# Patient Record
Sex: Male | Born: 1945 | Hispanic: Yes | Marital: Married | Smoking: Former smoker
Health system: Southern US, Community
[De-identification: ages and names within clinical notes are randomized; demographics above are authoritative.]

## PROBLEM LIST (undated history)

## (undated) DIAGNOSIS — M25569 Pain in unspecified knee: Secondary | ICD-10-CM

## (undated) HISTORY — PX: EYE SURGERY: SHX253

## (undated) HISTORY — PX: KNEE ARTHROSCOPY: SUR90

---

## 2011-07-05 ENCOUNTER — Ambulatory Visit: Payer: Self-pay | Admitting: Sports Medicine

## 2013-05-26 ENCOUNTER — Emergency Department: Payer: Self-pay | Admitting: Emergency Medicine

## 2014-02-09 ENCOUNTER — Ambulatory Visit: Payer: Self-pay | Admitting: Specialist

## 2014-05-18 IMAGING — CR DG KNEE COMPLETE 4+V*L*
1 series · 4 of 4 positions shown · non-contrast
Comparison: none

REASON FOR EXAM: pain/edema/ecchymosis 2nd to blunt trauma
COMMENTS:   May transport without cardiac monitor

PROCEDURE:     DXR - DXR KNEE LT COMP WITH OBLIQUES  - May 26, 2013 [DATE]
RESULT:

[Series 1: t knee ap left · 0.14mm/px · 4 of 4 slices shown]
[im 1/4]
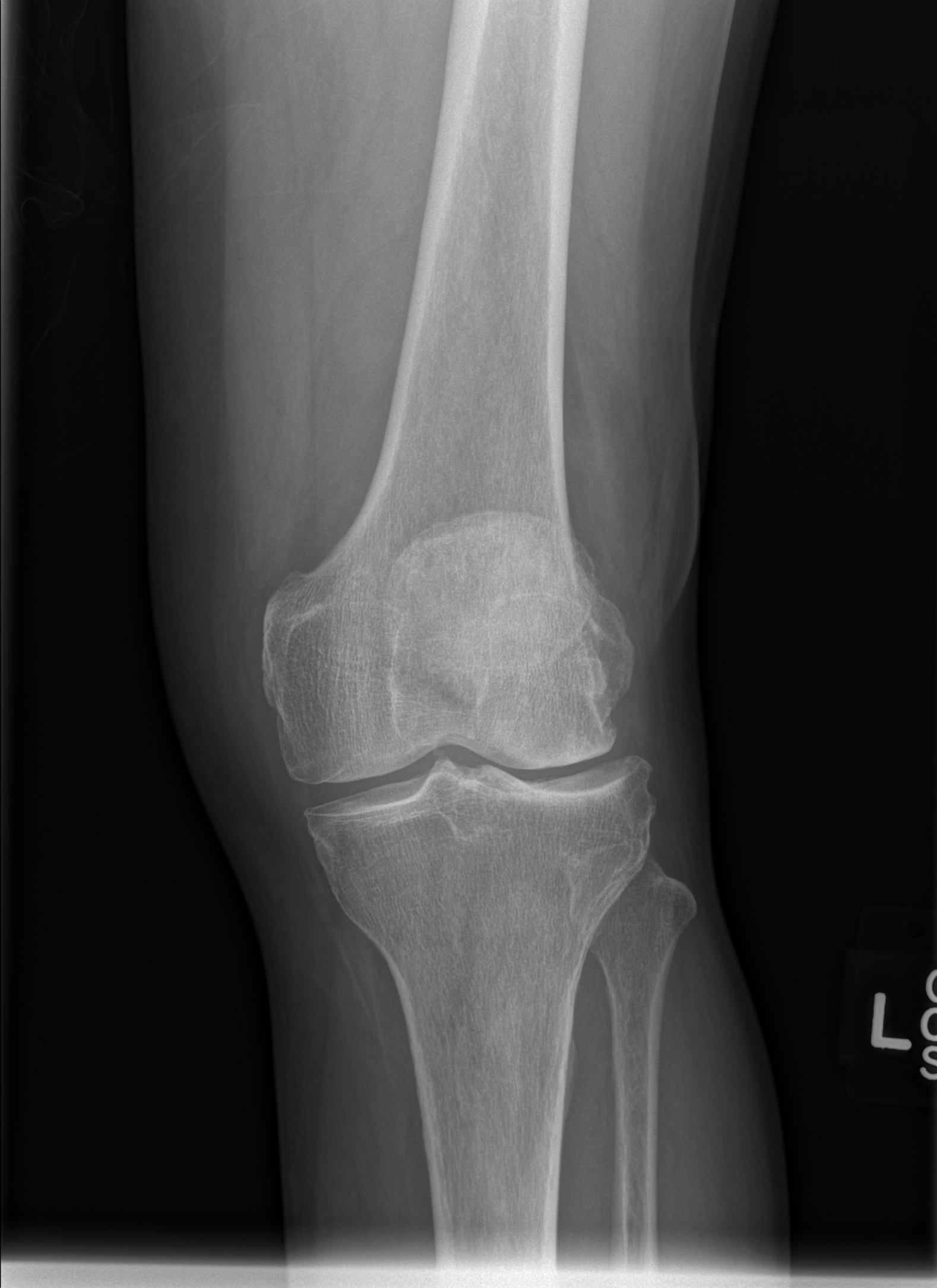
[im 2/4]
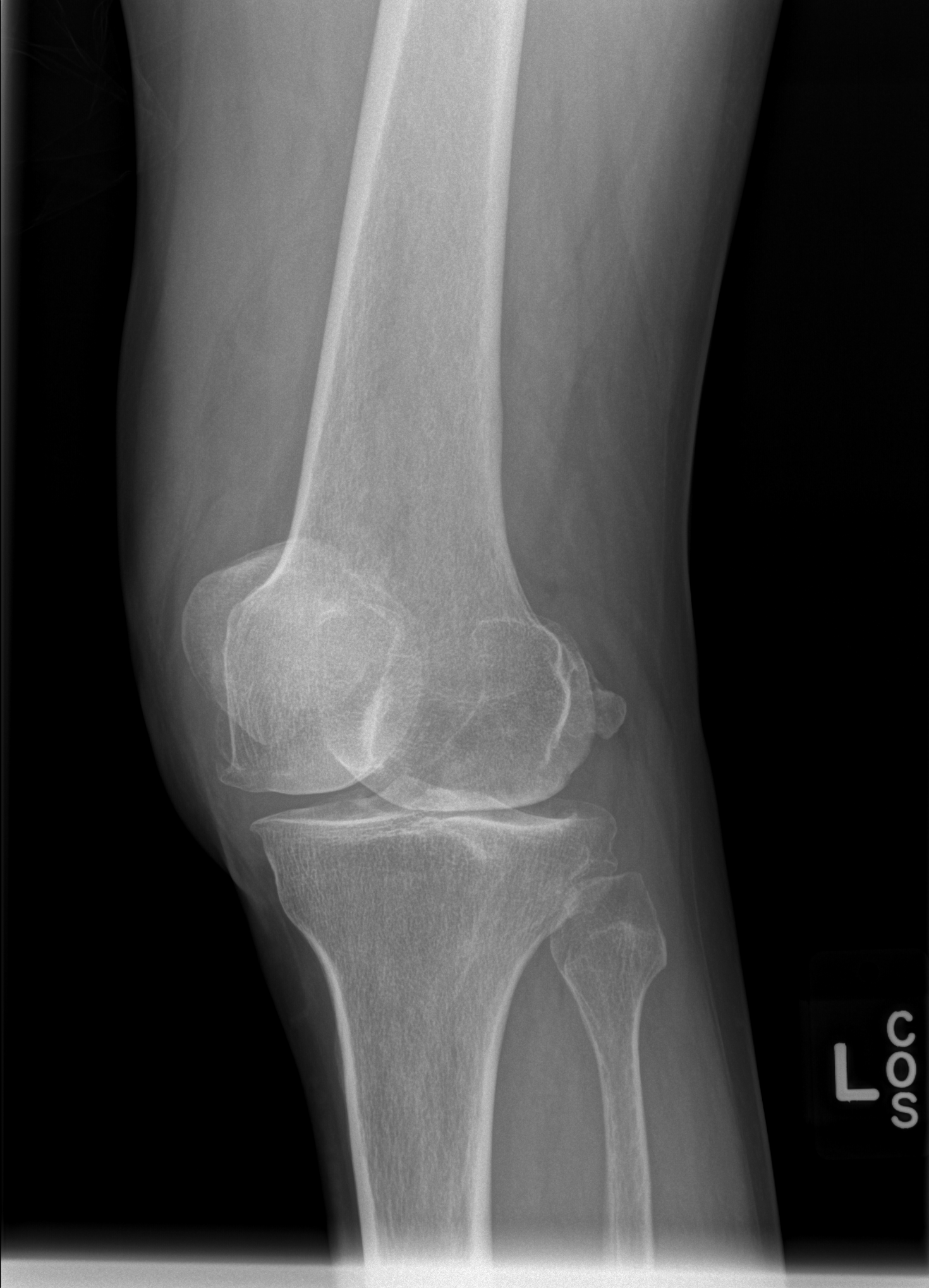
[im 3/4]
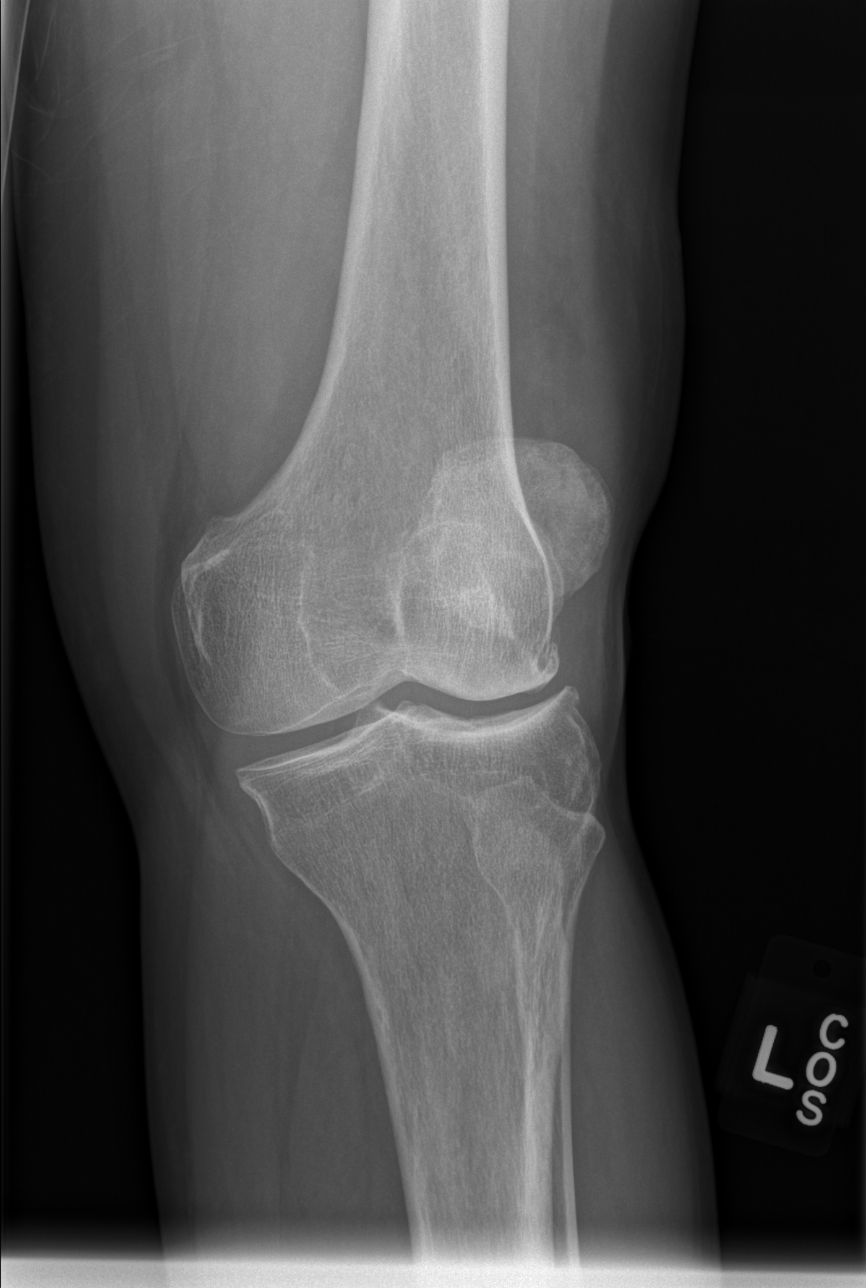
[im 4/4]
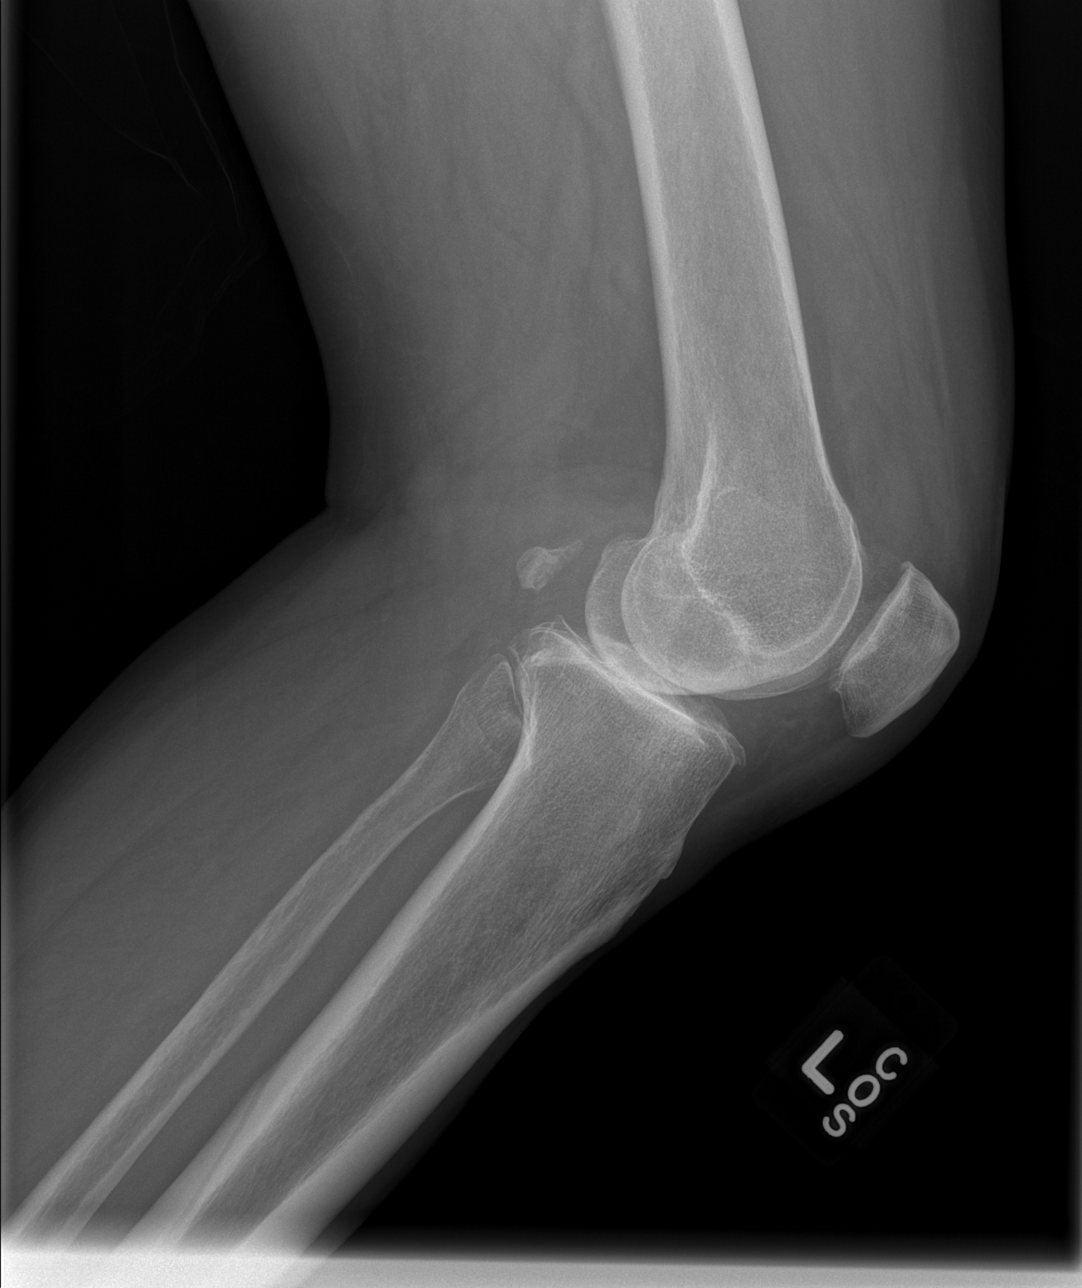

[4 of 4 positions shown; findings below may reference images not displayed]

FINDINGS: Osteophytosis is identified along the periphery of the femoral
condyles and tibial plateau as well as the articular portion of the patella.
There are findings consistent with a fabella. There is no evidence of acute
fracture or dislocation.
IMPRESSION: Osteoarthritic changes without evidence of acute osseous
abnormalities. If there is persistent clinical concern, further evaluation
with MRI is recommended.

## 2014-12-05 NOTE — Op Note (Signed)
PATIENT NAME:  Ralph Cobb, Ralph Cobb MR#:  [id number] DATE OF BIRTH:  [birth year]  DATE OF PROCEDURE:  02/09/2014  PREOPERATIVE DIAGNOSES:  1.  Tear of the left medial and lateral menisci.  2.  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  4.  Extensive synovitis.   POSTOPERATIVE DIAGNOSES:  1.  Tear of the left medial and lateral menisci.  2.  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  4.  Extensive synovitis.   PROCEDURES: 1.  Arthroscopic partial left medial and lateral meniscectomies.  2.  Arthroscopic chondroplasty lateral femur and tibia. 3.  Moderate synovectomy.   SURGEON: Valinda HoarHoward E. Miller, M.D.   ANESTHESIA: General LMA.   COMPLICATIONS: None.   DRAINS: None.   ESTIMATED BLOOD LOSS: Minimal.   REPLACEMENTS: None.   DESCRIPTION OF PROCEDURE: The patient was brought to the operating room where he underwent satisfactory general LMA anesthesia in the supine position. The left leg was prepped and draped in a sterile fashion and arthroscopy carried out through standard portals. The findings included tear of the posterior horn of the medial meniscus. The articular surfaces had minimal chondromalacia. The lateral compartment showed grade IV chondromalacia on the femur and tibia and some macerated lateral meniscus. The intercondylar notch was normal with intact anterior and posterior cruciates. The patellofemoral articulation showed mild grade II to III chondromalacia. There was extensive synovitis anteriorly and superiorly. After surveying the joint, the medial meniscus was debrided with a basket forceps and a motorized resector. The ArthroCare wand was used to stabilize this. Laterally the extensive debridement was carried out of the meniscus and soft tissues. A motorized resector and ArthroCare wand were used for this. Basket forceps was used. After this was trimmed back to healthy tissue, the partial synovectomy was carried out. ArthroCare was  used to cauterize bleeders. The joint was thoroughly irrigated free of debris and closed with 3-0 nylon sutures. Then 0.5% Marcaine with epinephrine and morphine was placed in the joint. Dry sterile dressing was applied. The tourniquet was not used. The patient was awakened and taken to recovery in good condition.  ____________________________ Valinda HoarHoward E. Miller, MD hem:sb D: 02/09/2014 14:42:26 ET T: 02/09/2014 15:00:40 ET JOB#: [id number]  cc: Valinda HoarHoward E. Miller, MD, <Dictator> Valinda HoarHOWARD E MILLER MD ELECTRONICALLY SIGNED 02/10/2014 10:58

## 2014-12-22 ENCOUNTER — Encounter
Admission: RE | Admit: 2014-12-22 | Discharge: 2014-12-22 | Disposition: A | Discharge status: Home / Self Care | Payer: BLUE CROSS/BLUE SHIELD | Source: Ambulatory Visit | Attending: Ophthalmology | Admitting: Ophthalmology

## 2014-12-22 DIAGNOSIS — Z01810 Encounter for preprocedural cardiovascular examination: Secondary | ICD-10-CM | POA: Diagnosis not present

## 2014-12-24 ENCOUNTER — Encounter: Payer: Self-pay | Admitting: *Deleted

## 2014-12-28 NOTE — OR Nursing (Signed)
ekg ok per anesthesia 

## 2014-12-28 NOTE — OR Nursing (Signed)
EKG TO ANESTHESIA FOR REVIEW

## 2014-12-30 ENCOUNTER — Ambulatory Visit: Admission: RE | Admit: 2014-12-30 | Payer: BLUE CROSS/BLUE SHIELD | Source: Ambulatory Visit | Admitting: Ophthalmology

## 2014-12-30 HISTORY — DX: Pain in unspecified knee: M25.569

## 2014-12-30 SURGERY — PARS PLANA VITRECTOMY WITH 25 GAUGE
Anesthesia: Choice | Laterality: Right

## 2018-07-30 ENCOUNTER — Ambulatory Visit (INDEPENDENT_AMBULATORY_CARE_PROVIDER_SITE_OTHER): Payer: 59 | Admitting: Vascular Surgery

## 2018-07-30 ENCOUNTER — Encounter (INDEPENDENT_AMBULATORY_CARE_PROVIDER_SITE_OTHER): Payer: Self-pay | Admitting: Vascular Surgery

## 2018-07-30 VITALS — BP 159/74 | HR 54 | Resp 16 | Ht 62.0 in | Wt 174.4 lb

## 2018-07-30 DIAGNOSIS — M79609 Pain in unspecified limb: Secondary | ICD-10-CM | POA: Insufficient documentation

## 2018-07-30 DIAGNOSIS — I739 Peripheral vascular disease, unspecified: Secondary | ICD-10-CM | POA: Diagnosis not present

## 2018-07-30 DIAGNOSIS — M79604 Pain in right leg: Secondary | ICD-10-CM | POA: Diagnosis not present

## 2018-07-30 DIAGNOSIS — M79605 Pain in left leg: Secondary | ICD-10-CM

## 2018-07-30 NOTE — Assessment & Plan Note (Signed)
The patient describes pain that is not typical for ischemic pain.  This is more consistent with arthritic knee pain and neuropathic issues.  He has already had a knee replacement on the right with good results.  He does have PAD based on the noninvasive studies performed by his primary care physician.  We are going to get more detailed studies for further evaluation.

## 2018-07-30 NOTE — Progress Notes (Signed)
Patient ID: Ralph Cobb, male   DOB: October 23, 1945, 72 y.o.   MRN: [id number]  Chief Complaint  Patient presents with  . New Patient (Initial Visit)    HPI Ralph Cobb is a 72 y.o. male.  I am asked to see the patient by Dr. Ephraim HamburgerMancheno for evaluation of PAD.  The patient reports dominantly pain in his left knee and lower leg.  This hurts a fair bit when he walks.  He reports no open ulcerations or infection.  He does not describe typical claudication symptoms.  His right knee was hurting significantly before he had a knee replacement that seems to have corrected that.  No fevers or chills.  No chest pain or shortness of breath.  As part of his work-up, his primary care physician ordered ABIs.  I do not have those studies for review, but by his primary care physician's report the right ABI was markedly reduced at 0.21.  I do not know what his left ABI was.   Past Medical History:  Diagnosis Date  . Knee pain     Past Surgical History:  Procedure Laterality Date  . EYE SURGERY    . KNEE ARTHROSCOPY      Family History No bleeding disorders, clotting disorders, autoimmune diseases, or aneurysms  Social History Social History   Tobacco Use  . Smoking status: Former Games developermoker  . Smokeless tobacco: Never Used  Substance Use Topics  . Alcohol use: No  . Drug use: Not on file    No Known Allergies  Current Outpatient Medications  Medication Sig Dispense Refill  . bimatoprost (LUMIGAN) 0.01 % SOLN Lumigan 0.01 % eye drops    . Brinzolamide-Brimonidine (SIMBRINZA) 1-0.2 % SUSP Simbrinza 1 %-0.2 % eye drops,suspension    . DULoxetine (CYMBALTA) 30 MG capsule Take 30 mg by mouth daily.    . meloxicam (MOBIC) 7.5 MG tablet meloxicam 7.5 mg tablet     No current facility-administered medications for this visit.       REVIEW OF SYSTEMS (Negative unless checked)  Constitutional: [] Weight loss  [] Fever  [] Chills Cardiac: [] Chest pain   [] Chest pressure   [] Palpitations    [] Shortness of breath when laying flat   [] Shortness of breath at rest   [] Shortness of breath with exertion. Vascular:  [x] Pain in legs with walking   [x] Pain in legs at rest   [] Pain in legs when laying flat   [] Claudication   [] Pain in feet when walking  [] Pain in feet at rest  [] Pain in feet when laying flat   [] History of DVT   [] Phlebitis   [] Swelling in legs   [] Varicose veins   [] Non-healing ulcers Pulmonary:   [] Uses home oxygen   [] Productive cough   [] Hemoptysis   [] Wheeze  [] COPD   [] Asthma Neurologic:  [] Dizziness  [] Blackouts   [] Seizures   [] History of stroke   [] History of TIA  [] Aphasia   [] Temporary blindness   [] Dysphagia   [] Weakness or numbness in arms   [] Weakness or numbness in legs Musculoskeletal:  [x] Arthritis   [] Joint swelling   [x] Joint pain   [] Low back pain Hematologic:  [] Easy bruising  [] Easy bleeding   [] Hypercoagulable state   [] Anemic  [] Hepatitis Gastrointestinal:  [] Blood in stool   [] Vomiting blood  [] Gastroesophageal reflux/heartburn   [] Abdominal pain Genitourinary:  [] Chronic kidney disease   [] Difficult urination  [] Frequent urination  [] Burning with urination   [] Hematuria Skin:  [] Rashes   [] Ulcers   [] Wounds Psychological:  [] History of  anxiety   []  History of major depression.    Physical Exam BP (!) 159/74 (BP Location: Right Arm, Patient Position: Sitting)   Pulse (!) 54   Resp 16   Ht 5\' 2"  (1.575 m)   Wt 174 lb 6.4 oz (79.1 kg)   BMI 31.90 kg/m  Gen:  WD/WN, NAD Head: [place]/AT, No temporalis wasting.  Ear/Nose/Throat: Hearing grossly intact, nares w/o erythema or drainage, oropharynx w/o Erythema/Exudate Eyes: Conjunctiva clear, sclera non-icteric  Neck: trachea midline.   Pulmonary:  Good air movement, respirations not labored, no use of accessory muscles Cardiac: RRR, no JVD Vascular:  Vessel Right Left  Radial Palpable Palpable                          PT  1+ palpable  1+ palpable  DP  not palpable  1+ palpable    Gastrointestinal: soft, non-tender/non-distended.  Musculoskeletal: M/S 5/5 throughout.  Extremities without ischemic changes.  No deformity or atrophy.  No edema. Neurologic: Sensation grossly intact in extremities.  Symmetrical.  Speech is fluent. Motor exam as listed above. Psychiatric: Judgment intact, Mood & affect appropriate for pt's clinical situation. Dermatologic: No rashes or ulcers noted.  No cellulitis or open wounds.   Radiology No results found.  Labs No results found for this or any previous visit (from the past 2160 hour(s)).  Assessment/Plan:  Pain in limb The patient describes pain that is not typical for ischemic pain.  This is more consistent with arthritic knee pain and neuropathic issues.  He has already had a knee replacement on the right with good results.  He does have PAD based on the noninvasive studies performed by his primary care physician.  We are going to get more detailed studies for further evaluation.  PAD (peripheral artery disease) (HCC) The patient had noninvasive studies performed by his primary care physician which would suggest critical limb ischemia in the right leg with an ABI of 0.21.  I did not get the report of the left ABI.  His symptoms do not seem as consistent with critical limb ischemia, but further evaluation with more detailed studies is certainly warranted at this time.  I discussed the pathophysiology and natural history of peripheral arterial disease.  I have discussed the general treatment options.  We will get these noninvasive studies in the near future at his convenience.  We will see him back following the studies to discuss the results and determine further treatment options.      Festus Barren 07/30/2018, 1:24 PM   This note was created with Dragon medical transcription system.  Any errors from dictation are unintentional.

## 2018-07-30 NOTE — Patient Instructions (Signed)

## 2018-07-30 NOTE — Assessment & Plan Note (Signed)
The patient had noninvasive studies performed by his primary care physician which would suggest critical limb ischemia in the right leg with an ABI of 0.21.  I did not get the report of the left ABI.  His symptoms do not seem as consistent with critical limb ischemia, but further evaluation with more detailed studies is certainly warranted at this time.  I discussed the pathophysiology and natural history of peripheral arterial disease.  I have discussed the general treatment options.  We will get these noninvasive studies in the near future at his convenience.  We will see him back following the studies to discuss the results and determine further treatment options.

## 2018-07-31 ENCOUNTER — Ambulatory Visit (INDEPENDENT_AMBULATORY_CARE_PROVIDER_SITE_OTHER): Payer: Medicare Other | Admitting: Nurse Practitioner

## 2018-07-31 ENCOUNTER — Ambulatory Visit (INDEPENDENT_AMBULATORY_CARE_PROVIDER_SITE_OTHER): Payer: 59

## 2018-07-31 ENCOUNTER — Other Ambulatory Visit (INDEPENDENT_AMBULATORY_CARE_PROVIDER_SITE_OTHER): Payer: Self-pay | Admitting: Vascular Surgery

## 2018-07-31 ENCOUNTER — Encounter (INDEPENDENT_AMBULATORY_CARE_PROVIDER_SITE_OTHER): Payer: Self-pay | Admitting: Nurse Practitioner

## 2018-07-31 VITALS — BP 159/84 | HR 53 | Resp 12 | Ht 65.0 in | Wt 174.8 lb

## 2018-07-31 DIAGNOSIS — M1710 Unilateral primary osteoarthritis, unspecified knee: Secondary | ICD-10-CM | POA: Insufficient documentation

## 2018-07-31 DIAGNOSIS — M179 Osteoarthritis of knee, unspecified: Secondary | ICD-10-CM | POA: Insufficient documentation

## 2018-07-31 DIAGNOSIS — I739 Peripheral vascular disease, unspecified: Secondary | ICD-10-CM

## 2018-07-31 DIAGNOSIS — E785 Hyperlipidemia, unspecified: Secondary | ICD-10-CM | POA: Diagnosis not present

## 2018-07-31 DIAGNOSIS — G8929 Other chronic pain: Secondary | ICD-10-CM

## 2018-07-31 DIAGNOSIS — M25562 Pain in left knee: Secondary | ICD-10-CM

## 2018-07-31 DIAGNOSIS — M25569 Pain in unspecified knee: Secondary | ICD-10-CM | POA: Insufficient documentation

## 2018-07-31 NOTE — Progress Notes (Signed)
Subjective:    Patient ID: Ralph Cobb, male    DOB: [birth year], 72 y.o.   MRN: [id number] Chief Complaint  Patient presents with  . Follow-up    HPI  Ralph Cobb is a 72 y.o. male that presents today to follow-up on concerns for peripheral arterial disease.  The patient states that he had a provider come to his home to screen for Medicare.  During this examination they suggested that he may have decreased circulation and that he need to be evaluated.  The patient complains of pain in his left knee area.  He states his pain has been ongoing for approximately 3 years.  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  His pain is greatest when he rises from a sitting to standing position.  He states that once he begins to ambulate the pain eases away.  He states that the pain is also controlled with Tylenol and/or Advil.  The patient also states that this pain is consistent with the type pain that he had prior to his right knee replacement.  He denies any claudication-like symptoms or rest pain.  He denies any chest pain or shortness of breath.  He denies any TIA-like symptoms or amaurosis fugax.  He denies any fever, chills, nausea, or vomiting.  He underwent bilateral ABIs which revealed an ABI of 1.19 on his right with a TBI of 0.53.  He had an ABI of 1.21 on his left with a TBI of 0.74.  He had bilateral triphasic tibial waveforms.  He also underwent a right lower extremity duplex which revealed triphasic waveforms throughout his leg. Past Medical History:  Diagnosis Date  . Knee pain     Past Surgical History:  Procedure Laterality Date  . EYE SURGERY    . KNEE ARTHROSCOPY      Social History   Socioeconomic History  . Marital status: Married    Spouse name: Not on file  . Number of children: Not on file  . Years of education: Not on file  . Highest education level: Not on file  Occupational History  . Not on file  Social Needs  . Financial resource  strain: Not on file  . Food insecurity:    Worry: Not on file    Inability: Not on file  . Transportation needs:    Medical: Not on file    Non-medical: Not on file  Tobacco Use  . Smoking status: Former Games developermoker  . Smokeless tobacco: Never Used  Substance and Sexual Activity  . Alcohol use: No  . Drug use: Not on file  . Sexual activity: Not on file  Lifestyle  . Physical activity:    Days per week: Not on file    Minutes per session: Not on file  . Stress: Not on file  Relationships  . Social connections:    Talks on phone: Not on file    Gets together: Not on file    Attends religious service: Not on file    Active member of club or organization: Not on file    Attends meetings of clubs or organizations: Not on file    Relationship status: Not on file  . Intimate partner violence:    Fear of current or ex partner: Not on file    Emotionally abused: Not on file    Physically abused: Not on file    Forced sexual activity: Not on file  Other Topics Concern  . Not  on file  Social History Narrative  . Not on file    No family history on file.  No Known Allergies   Review of Systems   Review of Systems: Negative Unless Checked Constitutional: [] Weight loss  [] Fever  [] Chills Cardiac: [] Chest pain   []  Atrial Fibrillation  [] Palpitations   [] Shortness of breath when laying flat   [] Shortness of breath with exertion. [] Shortness of breath at rest Vascular:  [] Pain in legs with walking   [] Pain in legs with standing [] Pain in legs when laying flat   [] Claudication    [] Pain in feet when laying flat    [] History of DVT   [] Phlebitis   [] Swelling in legs   [] Varicose veins   [] Non-healing ulcers Pulmonary:   [] Uses home oxygen   [] Productive cough   [] Hemoptysis   [] Wheeze  [] COPD   [] Asthma Neurologic:  [] Dizziness   [] Seizures  [] Blackouts [] History of stroke   [] History of TIA  [] Aphasia   [] Temporary Blindness   [] Weakness or numbness in arm   [] Weakness or numbness in  leg Musculoskeletal:   [x] Joint swelling   [x] Joint pain   [] Low back pain  [x]  History of Knee Replacement [x] Arthritis [] back Surgeries  []  Spinal Stenosis    Hematologic:  [] Easy bruising  [] Easy bleeding   [] Hypercoagulable state   [] Anemic Gastrointestinal:  [] Diarrhea   [] Vomiting  [] Gastroesophageal reflux/heartburn   [] Difficulty swallowing. [] Abdominal pain Genitourinary:  [] Chronic kidney disease   [] Difficult urination  [] Anuric   [] Blood in urine [] Frequent urination  [] Burning with urination   [] Hematuria Skin:  [] Rashes   [] Ulcers [] Wounds Psychological:  [] History of anxiety   []  History of major depression  []  Memory Difficulties     Objective:   Physical Exam  BP (!) 159/84 (BP Location: Right Arm, Patient Position: Sitting)   Pulse (!) 53   Resp 12   Ht 5\' 5"  (1.651 m)   Wt 174 lb 12.8 oz (79.3 kg)   BMI 29.09 kg/m   Gen: WD/WN, NAD Head: [place]/AT, No temporalis wasting.  Ear/Nose/Throat: Hearing grossly intact, nares w/o erythema or drainage Eyes: PER, EOMI, sclera nonicteric.  Neck: Supple, no masses.  No JVD.  Pulmonary:  Good air movement, no use of accessory muscles.  Cardiac: RRR Vascular:  Vessel Right Left  Radial Palpable Palpable  Dorsalis Pedis Palpable Palpable  Posterior Tibial Palpable Palpable   Gastrointestinal: soft, non-distended. No guarding/no peritoneal signs.  Musculoskeletal: M/S 5/5 throughout.  No deformity or atrophy.  Neurologic: Pain and light touch intact in extremities.  Symmetrical.  Speech is fluent. Motor exam as listed above. Psychiatric: Judgment intact, Mood & affect appropriate for pt's clinical situation. Dermatologic: No Venous rashes. No Ulcers Noted.  No changes consistent with cellulitis. Lymph : No Cervical lymphadenopathy, no lichenification or skin changes of chronic lymphedema.      Assessment & Plan:   1. Chronic pain of left knee Based upon the patient's description of pain as well as prior right knee  replacement, it is likely that the pain is due to osteoarthritis.  The patient agrees that this pain is also consistent with arthritis pain he feels.  He will follow-up with his orthopedic provider.  2. Hyperlipidemia, unspecified hyperlipidemia type Continue statin as ordered and reviewed, no changes at this time   3. PAD (peripheral artery disease) (HCC) He underwent bilateral ABIs which revealed an ABI of 1.19 on his right with a TBI of 0.53.  He had an ABI of 1.21 on his  left with a TBI of 0.74.  He had bilateral triphasic tibial waveforms.  He also underwent a right lower extremity duplex which revealed triphasic waveforms throughout his leg.  Peripheral arterial disease screening done by his primary care office had an ABI of 0.21 on his right lower extremity.  Based upon the results of the patient's test, as well as his descriptions of pain it is unlikely that he has peripheral arterial disease.  The patient will follow-up as needed.     Current Outpatient Medications on File Prior to Visit  Medication Sig Dispense Refill  . bimatoprost (LUMIGAN) 0.01 % SOLN Lumigan 0.01 % eye drops    . Brinzolamide-Brimonidine (SIMBRINZA) 1-0.2 % SUSP Simbrinza 1 %-0.2 % eye drops,suspension    . DULoxetine (CYMBALTA) 30 MG capsule Take 30 mg by mouth daily.    . meloxicam (MOBIC) 7.5 MG tablet meloxicam 7.5 mg tablet     No current facility-administered medications on file prior to visit.     There are no Patient Instructions on file for this visit. No follow-ups on file.   Georgiana Spinner, NP  This note was completed with Office manager.  Any errors are purely unintentional.

## 2018-10-17 ENCOUNTER — Encounter (INDEPENDENT_AMBULATORY_CARE_PROVIDER_SITE_OTHER): Payer: Self-pay

## 2020-11-03 ENCOUNTER — Ambulatory Visit: Payer: Medicare Other | Attending: Family Medicine | Admitting: Physical Therapy

## 2020-11-08 ENCOUNTER — Encounter: Payer: Medicare Other | Admitting: Physical Therapy

## 2020-11-08 ENCOUNTER — Ambulatory Visit: Payer: Medicare Other | Admitting: Physical Therapy

## 2020-11-09 ENCOUNTER — Ambulatory Visit: Payer: Medicare Other | Admitting: Physical Therapy

## 2020-11-10 ENCOUNTER — Encounter: Payer: Medicare Other | Admitting: Physical Therapy

## 2020-11-11 ENCOUNTER — Ambulatory Visit: Payer: Medicare Other | Admitting: Physical Therapy

## 2020-11-11 ENCOUNTER — Encounter: Payer: Medicare Other | Admitting: Physical Therapy

## 2020-11-15 ENCOUNTER — Encounter: Payer: Medicare Other | Admitting: Physical Therapy

## 2020-11-18 ENCOUNTER — Encounter: Payer: Medicare Other | Admitting: Physical Therapy

## 2020-11-22 ENCOUNTER — Encounter: Payer: Medicare Other | Admitting: Physical Therapy

## 2020-11-23 ENCOUNTER — Encounter: Payer: Medicare Other | Admitting: Physical Therapy

## 2020-11-24 ENCOUNTER — Encounter: Payer: Medicare Other | Admitting: Physical Therapy

## 2020-11-25 ENCOUNTER — Encounter: Payer: Medicare Other | Admitting: Physical Therapy

## 2020-11-29 ENCOUNTER — Encounter: Payer: Medicare Other | Admitting: Physical Therapy

## 2020-11-30 ENCOUNTER — Encounter: Payer: Medicare Other | Admitting: Physical Therapy

## 2020-12-02 ENCOUNTER — Encounter: Payer: Medicare Other | Admitting: Physical Therapy

## 2020-12-06 ENCOUNTER — Encounter: Payer: Medicare Other | Admitting: Physical Therapy

## 2020-12-07 ENCOUNTER — Encounter: Payer: Medicare Other | Admitting: Physical Therapy

## 2020-12-09 ENCOUNTER — Encounter: Payer: Medicare Other | Admitting: Physical Therapy

## 2020-12-13 ENCOUNTER — Encounter: Payer: Medicare Other | Admitting: Physical Therapy

## 2020-12-14 ENCOUNTER — Encounter: Payer: Medicare Other | Admitting: Physical Therapy

## 2020-12-16 ENCOUNTER — Encounter: Payer: Medicare Other | Admitting: Physical Therapy

## 2020-12-20 ENCOUNTER — Encounter: Payer: Medicare Other | Admitting: Physical Therapy

## 2020-12-21 ENCOUNTER — Encounter: Payer: Medicare Other | Admitting: Physical Therapy

## 2020-12-23 ENCOUNTER — Encounter: Payer: Medicare Other | Admitting: Physical Therapy

## 2020-12-28 ENCOUNTER — Encounter: Payer: Medicare Other | Admitting: Physical Therapy

## 2020-12-30 ENCOUNTER — Encounter: Payer: Medicare Other | Admitting: Physical Therapy

## 2021-01-04 ENCOUNTER — Encounter: Payer: Medicare Other | Admitting: Physical Therapy

## 2021-01-06 ENCOUNTER — Encounter: Payer: Medicare Other | Admitting: Physical Therapy

## 2021-01-11 ENCOUNTER — Encounter: Payer: Medicare Other | Admitting: Physical Therapy

## 2024-02-13 ENCOUNTER — Other Ambulatory Visit: Payer: Self-pay | Admitting: Orthopedic Surgery

## 2024-02-13 ENCOUNTER — Ambulatory Visit: Admission: RE | Admit: 2024-02-13 | Source: Ambulatory Visit

## 2024-02-13 DIAGNOSIS — M7989 Other specified soft tissue disorders: Secondary | ICD-10-CM

## 2024-02-14 ENCOUNTER — Ambulatory Visit
Admission: RE | Admit: 2024-02-14 | Discharge: 2024-02-14 | Disposition: A | Discharge status: Home / Self Care | Source: Ambulatory Visit | Attending: Orthopedic Surgery | Admitting: Orthopedic Surgery

## 2024-02-14 DIAGNOSIS — M7989 Other specified soft tissue disorders: Secondary | ICD-10-CM | POA: Diagnosis present
